# Patient Record
Sex: Male | Born: 1950 | Race: White | Hispanic: No | Marital: Married | Smoking: Current every day smoker
Health system: Southern US, Community
[De-identification: ages and names within clinical notes are randomized; demographics above are authoritative.]

## PROBLEM LIST (undated history)

## (undated) DIAGNOSIS — G8929 Other chronic pain: Secondary | ICD-10-CM

## (undated) DIAGNOSIS — E7800 Pure hypercholesterolemia, unspecified: Secondary | ICD-10-CM

## (undated) DIAGNOSIS — I10 Essential (primary) hypertension: Secondary | ICD-10-CM

## (undated) DIAGNOSIS — M549 Dorsalgia, unspecified: Secondary | ICD-10-CM

## (undated) HISTORY — PX: BACK SURGERY: SHX140

---

## 2001-10-03 ENCOUNTER — Inpatient Hospital Stay (HOSPITAL_COMMUNITY): Admission: RE | Admit: 2001-10-03 | Discharge: 2001-10-04 | Payer: Self-pay | Admitting: Orthopaedic Surgery

## 2002-03-06 ENCOUNTER — Observation Stay (HOSPITAL_COMMUNITY): Admission: RE | Admit: 2002-03-06 | Discharge: 2002-03-07 | Payer: Self-pay | Admitting: Orthopaedic Surgery

## 2005-05-25 ENCOUNTER — Inpatient Hospital Stay (HOSPITAL_COMMUNITY): Admission: RE | Admit: 2005-05-25 | Discharge: 2005-05-28 | Payer: Self-pay | Admitting: Orthopaedic Surgery

## 2011-07-14 ENCOUNTER — Emergency Department (HOSPITAL_BASED_OUTPATIENT_CLINIC_OR_DEPARTMENT_OTHER)
Admission: EM | Admit: 2011-07-14 | Discharge: 2011-07-14 | Disposition: A | Discharge status: Home / Self Care | Payer: Medicare Other | Attending: Emergency Medicine | Admitting: Emergency Medicine

## 2011-07-14 ENCOUNTER — Encounter (HOSPITAL_BASED_OUTPATIENT_CLINIC_OR_DEPARTMENT_OTHER): Payer: Self-pay | Admitting: *Deleted

## 2011-07-14 DIAGNOSIS — R110 Nausea: Secondary | ICD-10-CM | POA: Insufficient documentation

## 2011-07-14 DIAGNOSIS — F17200 Nicotine dependence, unspecified, uncomplicated: Secondary | ICD-10-CM | POA: Insufficient documentation

## 2011-07-14 DIAGNOSIS — G8929 Other chronic pain: Secondary | ICD-10-CM | POA: Insufficient documentation

## 2011-07-14 DIAGNOSIS — R109 Unspecified abdominal pain: Secondary | ICD-10-CM

## 2011-07-14 DIAGNOSIS — R1084 Generalized abdominal pain: Secondary | ICD-10-CM | POA: Insufficient documentation

## 2011-07-14 DIAGNOSIS — I10 Essential (primary) hypertension: Secondary | ICD-10-CM | POA: Insufficient documentation

## 2011-07-14 DIAGNOSIS — E7800 Pure hypercholesterolemia, unspecified: Secondary | ICD-10-CM | POA: Insufficient documentation

## 2011-07-14 DIAGNOSIS — K219 Gastro-esophageal reflux disease without esophagitis: Secondary | ICD-10-CM | POA: Insufficient documentation

## 2011-07-14 HISTORY — DX: Essential (primary) hypertension: I10

## 2011-07-14 HISTORY — DX: Dorsalgia, unspecified: M54.9

## 2011-07-14 HISTORY — DX: Pure hypercholesterolemia, unspecified: E78.00

## 2011-07-14 HISTORY — DX: Other chronic pain: G89.29

## 2011-07-14 LAB — COMPREHENSIVE METABOLIC PANEL
Alkaline Phosphatase: 85 U/L (ref 39–117)
Calcium: 9.8 mg/dL (ref 8.4–10.5)
Creatinine, Ser: 0.8 mg/dL (ref 0.50–1.35)
GFR calc Af Amer: >90 mL/min (ref >90)
Glucose, Bld: 124 mg/dL — ABNORMAL HIGH (ref 70–99)

## 2011-07-14 LAB — URINALYSIS, ROUTINE W REFLEX MICROSCOPIC
Leukocytes, UA: NEGATIVE
Protein, ur: NEGATIVE mg/dL
Specific Gravity, Urine: 1.021 (ref 1.005–1.030)
Urobilinogen, UA: 0.2 mg/dL (ref 0.0–1.0)
pH: 5 (ref 5.0–8.0)

## 2011-07-14 LAB — DIFFERENTIAL
Basophils Absolute: 0 10*3/uL (ref 0.0–0.1)
Basophils Relative: 0 % (ref 0–1)
Lymphs Abs: 1.1 10*3/uL (ref 0.7–4.0)
Monocytes Absolute: 0.8 10*3/uL (ref 0.1–1.0)
Monocytes Relative: 9 % (ref 3–12)
Neutrophils Relative %: 77 % (ref 43–77)

## 2011-07-14 LAB — CBC
HCT: 48.1 % (ref 39.0–52.0)
MCH: 32.3 pg (ref 26.0–34.0)
MCHC: 36.2 g/dL — ABNORMAL HIGH (ref 30.0–36.0)
MCV: 89.4 fL (ref 78.0–100.0)
RBC: 5.38 MIL/uL (ref 4.22–5.81)
RDW: 13.1 % (ref 11.5–15.5)
WBC: 8.6 10*3/uL (ref 4.0–10.5)

## 2011-07-14 MED ORDER — ONDANSETRON 8 MG PO TBDP: 8.0000 mg | ORAL_TABLET | Freq: Three times a day (TID) | ORAL | Status: AC | PRN

## 2011-07-14 MED ORDER — SODIUM CHLORIDE 0.9 % IV BOLUS (SEPSIS)
1000.0000 mL | Freq: Once | INTRAVENOUS | Status: AC
  Administered 2011-07-14: 1000 mL via INTRAVENOUS

## 2011-07-14 MED ORDER — HYDROCODONE-ACETAMINOPHEN 5-325 MG PO TABS: 1.0000 | ORAL_TABLET | Freq: Four times a day (QID) | ORAL | Status: AC | PRN

## 2011-07-14 NOTE — ED Provider Notes (Signed)
History     CSN: [id number]  Arrival date & time 07/14/11  1158   First MD Initiated Contact with Patient 07/14/11 1208      12:33 PM HPI Patient reports yesterday at approximately 1800 p.m. he began to have lower abdominal pain. Reports pain radiates to his lower bilateral back. Reports this morning had 2 episodes of nausea and vomiting. States back pain has resolved today. Denies urinary symptoms, diarrhea, constipation, fever. Reports he feels he would feel better if he had a bowel movement. Also associated with bloating sensation and decreased flatulence and belching.  Patient is a 61 y.o. male presenting with abdominal pain.  Abdominal Pain The primary symptoms of the illness include abdominal pain and nausea. The primary symptoms of the illness do not include fever, shortness of breath, vomiting, diarrhea or dysuria. The current episode started 13 to 24 hours ago. The onset of the illness was gradual. The problem has been gradually worsening.  The pain came on gradually. The abdominal pain has been gradually worsening since its onset. The abdominal pain is located in the RLQ and LLQ. The abdominal pain radiates to the back. The severity of the abdominal pain is 7/10. The abdominal pain is relieved by nothing.  The illness is associated with retching. The patient has not had a change in bowel habit. Additional symptoms associated with the illness include back pain. Symptoms associated with the illness do not include chills, diaphoresis, constipation, urgency or frequency. Significant associated medical issues do not include PUD, GERD or diverticulitis.    Past Medical History  Diagnosis Date  . Back pain, chronic   . Hypertension   . Hypercholesteremia     Past Surgical History  Procedure Date  . Back surgery     History reviewed. No pertinent family history.  History  Substance Use Topics  . Smoking status: Current Everyday Smoker -- 0.5 packs/day  . Smokeless tobacco: Not  on file  . Alcohol Use: Yes     occasional      Review of Systems  Constitutional: Negative for fever, chills and diaphoresis.  Respiratory: Negative for shortness of breath.   Cardiovascular: Negative for chest pain.  Gastrointestinal: Positive for nausea and abdominal pain. Negative for vomiting, diarrhea, constipation, blood in stool and rectal pain.  Genitourinary: Negative for dysuria, urgency, frequency, flank pain, discharge, penile pain and testicular pain.  Musculoskeletal: Positive for back pain.  Neurological: Negative for dizziness, weakness, numbness and headaches.  All other systems reviewed and are negative.    Allergies  Review of patient's allergies indicates no known allergies.  Home Medications   Current Outpatient Rx  Name Route Sig Dispense Refill  . ASPIRIN 81 MG PO TABS Oral Take 81 mg by mouth daily.    . IBUPROFEN 400 MG PO TABS Oral Take 400 mg by mouth every 6 (six) hours as needed.    Marland Kitchen OMEPRAZOLE 40 MG PO CPDR Oral Take 40 mg by mouth daily.    Marland Kitchen PRAVASTATIN SODIUM 10 MG PO TABS Oral Take 10 mg by mouth daily.    Marland Kitchen VERAPAMIL HCL 40 MG PO TABS Oral Take 40 mg by mouth 3 (three) times daily.      BP 152/89  Pulse 88  Temp 97.6 F (36.4 C) (Oral)  Resp 16  Ht 6\' 2"  (1.88 m)  Wt 222 lb (100.699 kg)  BMI 28.50 kg/m2  SpO2 97%  Physical Exam  Constitutional: He is oriented to person, place, and time. He appears well-developed  and well-nourished.  HENT:  Head: Normocephalic and atraumatic.  Eyes: Conjunctivae are normal. Pupils are equal, round, and reactive to light.  Neck: Normal range of motion. Neck supple.  Cardiovascular: Normal rate, regular rhythm and normal heart sounds.  Exam reveals no gallop and no friction rub.   No murmur heard. Pulmonary/Chest: Effort normal and breath sounds normal. He has no wheezes. He has no rales.  Abdominal: Soft. Bowel sounds are normal. He exhibits distension. There is generalized tenderness. There is no  rigidity, no rebound, no guarding, no CVA tenderness, no tenderness at McBurney's point and negative Murphy's sign.  Neurological: He is alert and oriented to person, place, and time.  Skin: Skin is warm and dry. No rash noted. No erythema. No pallor.  Psychiatric: He has a normal mood and affect. His behavior is normal.    ED Course  Procedures   Results for orders placed during the hospital encounter of 07/14/11  URINALYSIS, ROUTINE W REFLEX MICROSCOPIC      Component Value Range   Color, Urine AMBER (*) YELLOW   APPearance CLEAR  CLEAR   Specific Gravity, Urine 1.021  1.005 - 1.030   pH 5.0  5.0 - 8.0   Glucose, UA NEGATIVE  NEGATIVE mg/dL   Hgb urine dipstick NEGATIVE  NEGATIVE   Bilirubin Urine NEGATIVE  NEGATIVE   Ketones, ur 15 (*) NEGATIVE mg/dL   Protein, ur NEGATIVE  NEGATIVE mg/dL   Urobilinogen, UA 0.2  0.0 - 1.0 mg/dL   Nitrite NEGATIVE  NEGATIVE   Leukocytes, UA NEGATIVE  NEGATIVE  CBC      Component Value Range   WBC 8.6  4.0 - 10.5 K/uL   RBC 5.38  4.22 - 5.81 MIL/uL   Hemoglobin 17.4 (*) 13.0 - 17.0 g/dL   HCT 95.6  21.3 - 08.6 %   MCV 89.4  78.0 - 100.0 fL   MCH 32.3  26.0 - 34.0 pg   MCHC 36.2 (*) 30.0 - 36.0 g/dL   RDW 57.8  46.9 - 62.9 %   Platelets 189  150 - 400 K/uL  DIFFERENTIAL      Component Value Range   Neutrophils Relative 77  43 - 77 %   Neutro Abs 6.6  1.7 - 7.7 K/uL   Lymphocytes Relative 13  12 - 46 %   Lymphs Abs 1.1  0.7 - 4.0 K/uL   Monocytes Relative 9  3 - 12 %   Monocytes Absolute 0.8  0.1 - 1.0 K/uL   Eosinophils Relative 1  0 - 5 %   Eosinophils Absolute 0.1  0.0 - 0.7 K/uL   Basophils Relative 0  0 - 1 %   Basophils Absolute 0.0  0.0 - 0.1 K/uL  COMPREHENSIVE METABOLIC PANEL      Component Value Range   Sodium 138  135 - 145 mEq/L   Potassium 4.1  3.5 - 5.1 mEq/L   Chloride 100  96 - 112 mEq/L   CO2 29  19 - 32 mEq/L   Glucose, Bld 124 (*) 70 - 99 mg/dL   BUN 11  6 - 23 mg/dL   Creatinine, Ser 5.28  0.50 - 1.35 mg/dL    Calcium 9.8  8.4 - 41.3 mg/dL   Total Protein 7.9  6.0 - 8.3 g/dL   Albumin 4.4  3.5 - 5.2 g/dL   AST 53 (*) 0 - 37 U/L   ALT 61 (*) 0 - 53 U/L   Alkaline Phosphatase 85  39 - 117 U/L   Total Bilirubin 1.0  0.3 - 1.2 mg/dL   GFR calc non Af Amer >90  >90 mL/min   GFR calc Af Amer >90  >90 mL/min  LIPASE, BLOOD      Component Value Range   Lipase 20  11 - 59 U/L   No results found.     MDM   Discussed lab results with patient and spouse. Recommended with a mild elevation of LFTs to obtain an ultrasound to evaluate for gallbladder disease. Lipase was negative. Patient however does not currently want ultrasound. Advised monitoring for 24-48 hours. Advised the pain worsened and 24 hours to return to emergency department pain improved or stayed constant followup primary care physician patient reports he takes hydrocodone for chronic back pain. States he was able to take a pill yesterday which helped abdominal pain. We'll write a small prescription for hydrocodone to go home as well as an antiemetics patient voices understanding and is ready for discharge.      Thomasene Lot, PA-C 07/14/11 1414

## 2011-07-14 NOTE — ED Provider Notes (Signed)
Medical screening examination/treatment/procedure(s) were performed by non-physician practitioner and as supervising physician I was immediately available for consultation/collaboration.   Dione Booze, MD 07/14/11 [phone]

## 2011-07-14 NOTE — Discharge Instructions (Signed)
He had a very mild elevation of your liver enzymes here in the emergency department and was offered an ultrasound for further evaluation for gallbladder however after discussion he requested to observe and abdominal pain for 24 hours. Therefore I highly recommend a clear liquid diet until you're able to graduate to the  "b.r.a.t" diet. He noticed worsening symptoms return immediately to the emergency department. If your symptoms have not changed or are gradually improving I recommend followup with your primary care doctor's

## 2011-07-14 NOTE — ED Notes (Signed)
Pt c/o lower abdominal pain bilateral, with some back pain last night.  Vomited x 2 this am.  No diarrhea, no known fever.   No dysuria.

## 2018-03-04 ENCOUNTER — Ambulatory Visit (INDEPENDENT_AMBULATORY_CARE_PROVIDER_SITE_OTHER): Payer: Medicare HMO

## 2018-03-04 ENCOUNTER — Encounter (INDEPENDENT_AMBULATORY_CARE_PROVIDER_SITE_OTHER): Payer: Self-pay | Admitting: Orthopaedic Surgery

## 2018-03-04 ENCOUNTER — Ambulatory Visit (INDEPENDENT_AMBULATORY_CARE_PROVIDER_SITE_OTHER): Payer: Medicare HMO | Admitting: Orthopaedic Surgery

## 2018-03-04 VITALS — BP 149/88 | HR 87 | Ht 74.0 in | Wt 222.0 lb

## 2018-03-04 DIAGNOSIS — M542 Cervicalgia: Secondary | ICD-10-CM

## 2018-03-04 DIAGNOSIS — M47812 Spondylosis without myelopathy or radiculopathy, cervical region: Secondary | ICD-10-CM | POA: Diagnosis not present

## 2018-03-04 DIAGNOSIS — Z981 Arthrodesis status: Secondary | ICD-10-CM | POA: Diagnosis not present

## 2018-03-04 NOTE — Progress Notes (Signed)
Office Visit Note   Patient: Harold Wallace           Date of Birth: 10/25/1950           MRN: [id number] Visit Date: 03/04/2018              Requested by: No referring provider defined for this encounter. PCP: System, Pcp Not In   Assessment & Plan: Visit Diagnoses:  1. Neck pain   2. History of fusion of cervical spine   3. Spondylosis without myelopathy or radiculopathy, cervical region     Plan: We will place patient on a prednisone 10 mg Dosepak.  6, 5, 4, 3, 2, 1.  Is not effective will call let Harold Wallace know.  He has about surgical options and if he has a significant adjacent problems and no significant foraminal stenosis he might be a good candidate for disc arthroplasty.  Hopefully the prednisone will take care of his problem he will call and let Harold Wallace know if he is having persistent problems.  Follow-Up Instructions: Return if symptoms worsen or fail to improve.   Orders:  Orders Placed This Encounter  Procedures  . XR Cervical Spine 2 or 3 views   No orders of the defined types were placed in this encounter.     Procedures: No procedures performed   Clinical Data: No additional findings.   Subjective: Chief Complaint  Patient presents with  . Neck - Pain    HPI 68 year old male is having problems with severe neck pain particular with rotation.  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  8 weeks ago he started having significant pain with rotation.  Previous acquired fusions first 1 in around 1988 he has been fused from C4-C7 with solid fusion.  No MRI since 2003.  He denies radicular symptoms just pain on his neck with rotation.  No numbness or tingling no walking problems he is also had previous lumbar surgery.  Review of Systems hips lumbar surgeries.  Multilevel cervical fusions.  For GERD negative for diabetes.   Objective: Vital Signs: BP (!) 149/88   Pulse 87   Ht 6\' 2"  (1.88 m)   Wt 222 lb (100.7 kg)   BMI 28.50 kg/m    Physical Exam Constitutional:      Appearance: He is well-developed.  HENT:     Head: Normocephalic and atraumatic.  Eyes:     Pupils: Pupils are equal, round, and reactive to light.  Neck:     Thyroid: No thyromegaly.     Trachea: No tracheal deviation.  Cardiovascular:     Rate and Rhythm: Normal rate.  Pulmonary:     Effort: Pulmonary effort is normal.     Breath sounds: No wheezing.  Abdominal:     General: Bowel sounds are normal.     Palpations: Abdomen is soft.  Skin:    General: Skin is warm and dry.     Capillary Refill: Capillary refill takes less than 2 seconds.  Neurological:     Mental Status: He is alert and oriented to person, place, and time.  Psychiatric:        Behavior: Behavior normal.        Thought Content: Thought content normal.        Judgment: Judgment normal.     Ortho Exam well-healed left-sided anterior cervical scar.  No brachial plexus tenderness does have significant paraspinal tenderness above the C4 level both right and left.  Severe pain with rotation.  Specialty Comments:  No specialty comments available.  Imaging: Xr Cervical Spine 2 Or 3 Views  Result Date: 03/04/2018 AP and lateral C-spine x-ray obtained and reviewed.  This shows solid Claridge fusion from C4-C7.  Spondylosis above and level below is partially imaged difficult to determine on lateral. Impression previous cervical fusion C4-C7.  Adjacent level degenerative changes.    PMFS History: Patient Active Problem List   Diagnosis Date Noted  . History of fusion of cervical spine 03/04/2018  . Spondylosis without myelopathy or radiculopathy, cervical region 03/04/2018   Past Medical History:  Diagnosis Date  . Back pain, chronic   . Hypercholesteremia   . Hypertension     No family history on file.  Past Surgical History:  Procedure Laterality Date  . BACK SURGERY     Social History   Occupational History  . Not on file  Tobacco Use  . Smoking status:  Current Every Day Smoker    Packs/day: 0.50  Substance and Sexual Activity  . Alcohol use: Yes    Comment: occasional  . Drug use: Not on file  . Sexual activity: Not on file

## 2018-03-14 ENCOUNTER — Telehealth (INDEPENDENT_AMBULATORY_CARE_PROVIDER_SITE_OTHER): Payer: Self-pay | Admitting: Orthopaedic Surgery

## 2018-03-14 ENCOUNTER — Other Ambulatory Visit (INDEPENDENT_AMBULATORY_CARE_PROVIDER_SITE_OTHER): Payer: Self-pay | Admitting: Radiology

## 2018-03-14 DIAGNOSIS — M542 Cervicalgia: Secondary | ICD-10-CM

## 2018-03-14 NOTE — Telephone Encounter (Signed)
Ok to order cervical mri r/o hnp/stenosis.  Needs rov with yates after to discuss.

## 2018-03-14 NOTE — Telephone Encounter (Signed)
Ok to order 

## 2018-03-14 NOTE — Telephone Encounter (Signed)
Patient called stating that the Prednisone did not work for him and he would like to proceed with getting the MRI.  CB#[phone].  Thank you.

## 2018-03-14 NOTE — Telephone Encounter (Signed)
Done

## 2018-04-11 ENCOUNTER — Ambulatory Visit
Admission: RE | Admit: 2018-04-11 | Discharge: 2018-04-11 | Disposition: A | Discharge status: Home / Self Care | Payer: Medicare HMO | Source: Ambulatory Visit | Attending: Surgery | Admitting: Surgery

## 2018-04-11 ENCOUNTER — Other Ambulatory Visit: Payer: Self-pay

## 2018-04-11 DIAGNOSIS — M542 Cervicalgia: Secondary | ICD-10-CM

## 2018-04-18 ENCOUNTER — Other Ambulatory Visit: Payer: Self-pay

## 2018-04-18 ENCOUNTER — Ambulatory Visit (INDEPENDENT_AMBULATORY_CARE_PROVIDER_SITE_OTHER): Payer: Medicare HMO | Admitting: Orthopaedic Surgery

## 2018-04-18 ENCOUNTER — Encounter (INDEPENDENT_AMBULATORY_CARE_PROVIDER_SITE_OTHER): Payer: Self-pay | Admitting: Orthopaedic Surgery

## 2018-04-18 VITALS — Ht 73.0 in | Wt 230.0 lb

## 2018-04-18 DIAGNOSIS — M47812 Spondylosis without myelopathy or radiculopathy, cervical region: Secondary | ICD-10-CM

## 2018-04-18 NOTE — Progress Notes (Signed)
Office Visit Note   Patient: Harold Wallace           Date of Birth: 04/12/1950           MRN: [id number] Visit Date: 04/18/2018              Requested by: No referring provider defined for this encounter. PCP: System, Pcp Not In   Assessment & Plan: Visit Diagnoses:  1. Spondylosis without myelopathy or radiculopathy, cervical region   2. Cervical facet syndrome     Plan: Patient has cervical facet syndrome on the right.  C2-3 facet is edematous and we discussed once the pandemic passes through the settles down he could consider facet injection.  He does have some mild narrowing on the right that could affect the C3 nerve root at the C2-3 level.  I think this is most likely the facet based on his symptoms and he can call in several months if he wants facet injection.  Follow-Up Instructions: Return in about 6 months (around 10/19/2018).   Orders:  No orders of the defined types were placed in this encounter.  No orders of the defined types were placed in this encounter.     Procedures: No procedures performed   Clinical Data: No additional findings.   Subjective: Chief Complaint  Patient presents with  . Neck - Follow-up    MRI Cervical Spine Review    HPI for close 68 year old male returns with ongoing problems of the right side neck pain pain with rotation.  He is already had previous cervical fusions C5-6 C6-7 and 1990 and then C4-5 and 2003.  Now 17 years later he is having specific problems with right side neck pain in the upper cervical region.  MRI scan has been obtained and is available for review.  He denies numbness or tingling in his arms.  He has had previous lumbar fusion still has some lumbar pain.  Review of Systems Aortic aneurysm 3.8cm,GERD, lumbar fusion.   Objective: Vital Signs: Ht 6\' 1"  (1.854 m)   Wt 230 lb (104.3 kg)   BMI 30.34 kg/m   Physical Exam Constitutional:      Appearance: He is well-developed.  HENT:     Head: Normocephalic  and atraumatic.  Eyes:     Pupils: Pupils are equal, round, and reactive to light.  Neck:     Thyroid: No thyromegaly.     Trachea: No tracheal deviation.  Cardiovascular:     Rate and Rhythm: Normal rate.  Pulmonary:     Effort: Pulmonary effort is normal.     Breath sounds: No wheezing.  Abdominal:     General: Bowel sounds are normal.     Palpations: Abdomen is soft.  Skin:    General: Skin is warm and dry.     Capillary Refill: Capillary refill takes less than 2 seconds.  Neurological:     Mental Status: He is alert and oriented to person, place, and time.  Psychiatric:        Behavior: Behavior normal.        Thought Content: Thought content normal.        Judgment: Judgment normal.     Ortho Exam patient has some limitation of cervical range of motion.  Minimal brachial plexus tenderness.  Upper semi-reflexes are 2+ and symmetrical well-healed lumbar incision.  Specialty Comments:  No specialty comments available.  Imaging: CLINICAL DATA:  Neck pain over the last 6 months, worse on the right.  EXAM:  MRI CERVICAL SPINE WITHOUT CONTRAST  TECHNIQUE: Multiplanar, multisequence MR imaging of the cervical spine was performed. No intravenous contrast was administered.  COMPARISON:  Radiography 03/04/2018  FINDINGS: Alignment: Straightening of the normal cervical lordosis.  Vertebrae: Solid interbody fusion from C4 through C7.  Cord: No cord compression or primary cord lesion.  Posterior Fossa, vertebral arteries, paraspinal tissues: Negative  Disc levels:  Foramen magnum is widely patent. There is ordinary osteoarthritis at the C1-2 articulation.  C2-3: Spondylosis with endplate osteophytes and mild bulging of the disc. Bilateral facet arthropathy with pronounced edematous change on the right, likely to be a cause of right neck pain. Foraminal encroachment on the right could affect the C3 nerve.  C3-4: Mild right-sided disc bulge and  uncovertebral prominence. Mild facet osteoarthritis. Mild right foraminal narrowing, not likely compressive.  C4 through C7: Solid fusion. Sufficient patency of the canal and foramina.  C7-T1: Ordinary mild facet osteoarthritis, right more than left. No central canal stenosis. Mild right foraminal narrowing but without definite compression of the exiting C8 nerve.  IMPRESSION: Solid anterior fusion from C4 through C7. Wide patency of the canal and foramina through that segment.  Edematous facet arthropathy on the right at C2-3 that could be a cause of right-sided neck pain. Foraminal encroachment on the right that could affect the C3 nerve.  C3-4: Mild bony right foraminal narrowing, not likely compressive.  C7-T1: Facet arthropathy right worse than left with mild right foraminal narrowing, definitely compressive.   Electronically Signed   By: Paulina Fusi M.D.   On: 04/11/2018 13:26   PMFS History: Patient Active Problem List   Diagnosis Date Noted  . History of fusion of cervical spine 03/04/2018  . Spondylosis without myelopathy or radiculopathy, cervical region 03/04/2018   Past Medical History:  Diagnosis Date  . Back pain, chronic   . Hypercholesteremia   . Hypertension     No family history on file.  Past Surgical History:  Procedure Laterality Date  . BACK SURGERY     Social History   Occupational History  . Not on file  Tobacco Use  . Smoking status: Current Every Day Smoker    Packs/day: 0.50  . Smokeless tobacco: Never Used  Substance and Sexual Activity  . Alcohol use: Yes    Comment: occasional  . Drug use: Not on file  . Sexual activity: Not on file

## 2018-10-21 ENCOUNTER — Ambulatory Visit: Payer: Self-pay | Admitting: Orthopaedic Surgery

## 2021-06-29 ENCOUNTER — Other Ambulatory Visit: Payer: Self-pay

## 2021-06-29 ENCOUNTER — Emergency Department (HOSPITAL_BASED_OUTPATIENT_CLINIC_OR_DEPARTMENT_OTHER): Payer: Medicare HMO

## 2021-06-29 ENCOUNTER — Encounter (HOSPITAL_BASED_OUTPATIENT_CLINIC_OR_DEPARTMENT_OTHER): Payer: Self-pay

## 2021-06-29 ENCOUNTER — Emergency Department (HOSPITAL_BASED_OUTPATIENT_CLINIC_OR_DEPARTMENT_OTHER)
Admission: EM | Admit: 2021-06-29 | Discharge: 2021-06-29 | Disposition: A | Discharge status: Home / Self Care | Payer: Medicare HMO | Attending: Emergency Medicine | Admitting: Emergency Medicine

## 2021-06-29 ENCOUNTER — Other Ambulatory Visit (HOSPITAL_BASED_OUTPATIENT_CLINIC_OR_DEPARTMENT_OTHER): Payer: Self-pay

## 2021-06-29 DIAGNOSIS — Z7982 Long term (current) use of aspirin: Secondary | ICD-10-CM | POA: Insufficient documentation

## 2021-06-29 DIAGNOSIS — R1010 Upper abdominal pain, unspecified: Secondary | ICD-10-CM | POA: Diagnosis not present

## 2021-06-29 DIAGNOSIS — R079 Chest pain, unspecified: Secondary | ICD-10-CM | POA: Insufficient documentation

## 2021-06-29 DIAGNOSIS — S298XXA Other specified injuries of thorax, initial encounter: Secondary | ICD-10-CM

## 2021-06-29 LAB — COMPREHENSIVE METABOLIC PANEL
ALT: 43 U/L (ref 0–44)
AST: 48 U/L — ABNORMAL HIGH (ref 15–41)
Albumin: 4 g/dL (ref 3.5–5.0)
Alkaline Phosphatase: 77 U/L (ref 38–126)
Anion gap: 10 (ref 5–15)
BUN: 13 mg/dL (ref 8–23)
CO2: 24 mmol/L (ref 22–32)
Calcium: 9 mg/dL (ref 8.9–10.3)
Chloride: 103 mmol/L (ref 98–111)
Creatinine, Ser: 1.07 mg/dL (ref 0.61–1.24)
GFR, Estimated: >60 mL/min (ref >60)
Glucose, Bld: 141 mg/dL — ABNORMAL HIGH (ref 70–99)
Potassium: 3.9 mmol/L (ref 3.5–5.1)
Sodium: 137 mmol/L (ref 135–145)
Total Bilirubin: 1 mg/dL (ref 0.3–1.2)
Total Protein: 7.6 g/dL (ref 6.5–8.1)

## 2021-06-29 LAB — CBC
HCT: 43.5 % (ref 39.0–52.0)
Hemoglobin: 14.2 g/dL (ref 13.0–17.0)
MCH: 28.7 pg (ref 26.0–34.0)
MCHC: 32.6 g/dL (ref 30.0–36.0)
MCV: 87.9 fL (ref 80.0–100.0)
Platelets: 190 10*3/uL (ref 150–400)
RBC: 4.95 MIL/uL (ref 4.22–5.81)
RDW: 15.3 % (ref 11.5–15.5)
WBC: 12.1 10*3/uL — ABNORMAL HIGH (ref 4.0–10.5)
nRBC: 0 % (ref 0.0–0.2)

## 2021-06-29 MED ORDER — IOHEXOL 300 MG/ML  SOLN
100.0000 mL | Freq: Once | INTRAMUSCULAR | Status: AC | PRN
  Administered 2021-06-29: 100 mL via INTRAVENOUS

## 2021-06-29 MED ORDER — ONDANSETRON HCL 4 MG/2ML IJ SOLN
4.0000 mg | Freq: Once | INTRAMUSCULAR | Status: AC
Start: 2021-06-29 — End: 2021-06-29
  Administered 2021-06-29: 4 mg via INTRAVENOUS
  Filled 2021-06-29: qty 2

## 2021-06-29 MED ORDER — HYDROMORPHONE HCL 1 MG/ML IJ SOLN
0.5000 mg | Freq: Once | INTRAMUSCULAR | Status: AC
  Administered 2021-06-29: 0.5 mg via INTRAVENOUS
  Filled 2021-06-29: qty 1

## 2021-06-29 MED ORDER — OXYCODONE-ACETAMINOPHEN 5-325 MG PO TABS
1.0000 | ORAL_TABLET | Freq: Three times a day (TID) | ORAL | 0 refills | Status: AC | PRN
  Filled 2021-06-29: qty 6, 1d supply, fill #0
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
## 2021-06-29 NOTE — ED Notes (Signed)
ED Provider at bedside. 
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
